# Patient Record
Sex: Female | Born: 1975 | Race: White | Hispanic: No | Marital: Single | Smoking: Never smoker
Health system: Southern US, Community
[De-identification: ages and names within clinical notes are randomized; demographics above are authoritative.]

## PROBLEM LIST (undated history)

## (undated) DIAGNOSIS — R42 Dizziness and giddiness: Secondary | ICD-10-CM

## (undated) DIAGNOSIS — M797 Fibromyalgia: Secondary | ICD-10-CM

---

## 2017-03-17 ENCOUNTER — Other Ambulatory Visit: Payer: Self-pay

## 2017-03-17 ENCOUNTER — Encounter: Payer: Self-pay | Admitting: Emergency Medicine

## 2017-03-17 ENCOUNTER — Emergency Department
Admission: EM | Admit: 2017-03-17 | Discharge: 2017-03-17 | Disposition: A | Discharge status: Home / Self Care | Payer: BC Managed Care – PPO | Attending: Emergency Medicine | Admitting: Emergency Medicine

## 2017-03-17 DIAGNOSIS — R55 Syncope and collapse: Secondary | ICD-10-CM

## 2017-03-17 HISTORY — DX: Fibromyalgia: M79.7

## 2017-03-17 HISTORY — DX: Dizziness and giddiness: R42

## 2017-03-17 LAB — CBC
HCT: 40.7 % (ref 35.0–47.0)
HEMOGLOBIN: 13.9 g/dL (ref 12.0–16.0)
MCH: 29.3 pg (ref 26.0–34.0)
MCHC: 34.3 g/dL (ref 32.0–36.0)
MCV: 85.5 fL (ref 80.0–100.0)
Platelets: 340 10*3/uL (ref 150–440)
RBC: 4.76 MIL/uL (ref 3.80–5.20)
RDW: 13.3 % (ref 11.5–14.5)
WBC: 14.7 10*3/uL — ABNORMAL HIGH (ref 3.6–11.0)

## 2017-03-17 LAB — COMPREHENSIVE METABOLIC PANEL
ALT: 26 U/L (ref 14–54)
AST: 27 U/L (ref 15–41)
Albumin: 4.1 g/dL (ref 3.5–5.0)
Alkaline Phosphatase: 71 U/L (ref 38–126)
Anion gap: 13 (ref 5–15)
BUN: 17 mg/dL (ref 6–20)
CO2: 21 mmol/L — ABNORMAL LOW (ref 22–32)
Calcium: 8.7 mg/dL — ABNORMAL LOW (ref 8.9–10.3)
Chloride: 103 mmol/L (ref 101–111)
Creatinine, Ser: 0.83 mg/dL (ref 0.44–1.00)
GFR calc Af Amer: >60 mL/min (ref >60)
GFR calc non Af Amer: >60 mL/min (ref >60)
Glucose, Bld: 122 mg/dL — ABNORMAL HIGH (ref 65–99)
Potassium: 4 mmol/L (ref 3.5–5.1)
Sodium: 137 mmol/L (ref 135–145)
Total Bilirubin: 0.4 mg/dL (ref 0.3–1.2)
Total Protein: 7.9 g/dL (ref 6.5–8.1)

## 2017-03-17 LAB — TROPONIN I: Troponin I: <0.03 ng/mL (ref <0.03)

## 2017-03-17 MED ORDER — SODIUM CHLORIDE 0.9 % IV BOLUS (SEPSIS)
500.0000 mL | Freq: Once | INTRAVENOUS | Status: AC
  Administered 2017-03-17: 500 mL via INTRAVENOUS

## 2017-03-17 NOTE — ED Notes (Signed)
E signature pad not working, Pt verbalized understanding of discharge and follow up intructions

## 2017-03-17 NOTE — Discharge Instructions (Signed)
You have been seen today in the Emergency Department (ED)  for syncope (passing out).  Your evaluation was reassuring, and we all agree that panic/anxiety likely contributed to your situation.  Your symptoms may be due to mild dehydration, so it is important that you drink plenty of non-alcoholic fluids.  As we discussed, lab results were not available due to equipment malfunction, but given that you have been feeling well during an extended period of observation, we agreed it is okay for you to go home.  Please call your regular doctor as soon as possible to schedule the next available clinic appointment to follow up with him/her regarding your visit to the ED and your symptoms.  Return to the Emergency Department (ED)  if you have any further syncopal episodes (pass out again) or develop ANY chest pain, pressure, tightness, trouble breathing, sudden sweating, or other symptoms that concern you.

## 2017-03-17 NOTE — ED Provider Notes (Signed)
[facility] Emergency Department Provider Note  ____________________________________________   First MD Initiated Contact with Patient 03/17/17 [phone]     (approximate)  I have reviewed the triage vital signs and the nursing notes.   HISTORY  Chief Complaint Loss of Consciousness    HPI Amy [name] is a 42 y.o. female who arrives by EMS for evaluation of 2 syncopal episodes and dizziness.  She is awake and alert and fully oriented at this time.  She reports that she has had some dizziness in the past after eating scallops.  Tonight she had shrimp and scallops for dinner when she went to bed and lie down she felt dizzy, as if the room was spinning.  She began to be concerned she was having allergic reaction and looked up on the Internet whether or not shellfish can cause dizziness.  She reports that she read about anaphylaxis and panicked that she was having an anaphylactic reaction.  She called her parents and told him to come over immediately because she thought she was having anaphylaxis, but before her parents could get there, she called 911.  She reports that she lie down on the floor and thinks she passed out while she was on the floor, then woke up, walked downstairs, and when she got to the front door she passed out again, striking her forehead against the door.  She was there when EMS arrived.  She has 2 small abrasions to her forehead.  She feels normal now after 500 mL of normal saline.  She does not have any additional dizziness.  EMS does report that she was hypotensive when they arrived but after 500 mL normal saline her blood pressure was within the normal range.  The only other symptom was nausea.  The patient reports that she has not been ill recently.  She denies fever/chills, chest pain, shortness of breath,  Vomiting, abdominal pain, and diarrhea.  She has not had similar episodes in the past but she said that she does suffer from panic attacks and  she asked somewhat embarrassed and says that she thinks that she had a panic attack when she thought she was having anaphylaxis.  She took no medications at home and her symptoms have resolved without any additional intervention such as Benadryl or steroids.   Past Medical History:  Diagnosis Date  . Fibromyalgia   . Vertigo     There are no active problems to display for this patient.   History reviewed. No pertinent surgical history.  Prior to Admission medications   Not on File    Allergies Amoxicillin; Naproxen; and Penicillins  History reviewed. No pertinent family history.  Social History Social History   Tobacco Use  . Smoking status: Never Smoker  . Smokeless tobacco: Never Used  Substance Use Topics  . Alcohol use: Not on file  . Drug use: Not on file    Review of Systems Constitutional: No fever/chills Eyes: No visual changes. ENT: No sore throat. Cardiovascular: Denies chest pain.  Reportedly had syncope x 2. Respiratory: Denies shortness of breath. Gastrointestinal: No abdominal pain.  Nausea, no vomiting.  No diarrhea.  No constipation. Genitourinary: Negative for dysuria. Musculoskeletal: Negative for neck pain.  Negative for back pain. Integumentary: Negative for rash. Neurological: Dizziness.  Negative for headaches, focal weakness or numbness.   ____________________________________________   PHYSICAL EXAM:  VITAL SIGNS: ED Triage Vitals  Enc Vitals Group     BP 03/17/17 0204 126/87  Pulse Rate 03/17/17 0204 65     Resp 03/17/17 0204 20     Temp --      Temp src --      SpO2 03/17/17 0204 100 %     Weight 03/17/17 0201 84.4 kg (186 lb)     Height 03/17/17 0201 1.575 m (5\' 2" )     Head Circumference --      Peak Flow --      Pain Score 03/17/17 0201 5     Pain Loc --      Pain Edu? --      Excl. in GC? --     Constitutional: Alert and oriented. Well appearing and in no acute distress. Eyes: Conjunctivae are normal.  Head:  Contusion and small abrasion on forehead in two separate locations (near hairline and in the center of forehead) Nose: No congestion/rhinnorhea. Mouth/Throat: Mucous membranes are moist. Neck: No stridor.  No meningeal signs.  No cervical spine tenderness to palpation. Cardiovascular: Normal rate, regular rhythm. Good peripheral circulation. Grossly normal heart sounds. Respiratory: Normal respiratory effort.  No retractions. Lungs CTAB. Gastrointestinal: Soft and nontender. No distention.  Musculoskeletal: No lower extremity tenderness nor edema. No gross deformities of extremities. Neurologic:  Normal speech and language. No gross focal neurologic deficits are appreciated.  Skin:  Skin is warm, dry and intact. No rash noted. Psychiatric: Mood and affect are normal. Speech and behavior are normal.  ____________________________________________   LABS (all labs ordered are listed, but only abnormal results are displayed)  Labs Reviewed  COMPREHENSIVE METABOLIC PANEL - Abnormal; Notable for the following components:      Result Value   CO2 21 (*)    Glucose, Bld 122 (*)    Calcium 8.7 (*)    All other components within normal limits  CBC - Abnormal; Notable for the following components:   WBC 14.7 (*)    All other components within normal limits  TROPONIN I  URINALYSIS, ROUTINE W REFLEX MICROSCOPIC   ____________________________________________  EKG  ED ECG REPORT I, Loleta Rose, the attending physician, personally viewed and interpreted this ECG.  Date: 03/17/2017 EKG Time: 2:05 AM Rate: 67 Rhythm: normal sinus rhythm QRS Axis: normal Intervals: normal ST/T Wave abnormalities: normal Narrative Interpretation: no evidence of acute ischemia  ____________________________________________  RADIOLOGY   ED MD interpretation: No indication for imaging  Official radiology report(s): No results  found.  ____________________________________________   PROCEDURES  Critical Care performed: No   Procedure(s) performed:   Procedures   ____________________________________________   INITIAL IMPRESSION / ASSESSMENT AND PLAN / ED COURSE  As part of my medical decision making, I reviewed the following data within the electronic MEDICAL RECORD NUMBER Nursing notes reviewed and incorporated, Labs reviewed  and EKG interpreted     Differential diagnosis includes, but is not limited to, vasovagal syncope, orthostatic hypotension/syncope, cardiogenic syncope, anaphylaxis, panic attack.  Based on her description, I think that she might of had a very mild allergic reaction to the scallops similar to prior but then she panicked she researched anaphylaxis.  Her symptoms got acutely worse and I think she had vasovagal episode(s).  Blood pressure is stable and she is asymptomatic with no postictal state.  She has a mild leukocytosis.  As of 4:35 AM, metabolic panel and troponin are not available.  The lab has been struggling for the last 2-1/2 hours and has had a lot of equipment malfunctions.  The patient is asymptomatic with a normal blood pressure.  She  has been able to get up and walk to the bathroom with no repeat dizziness and certainly no additional syncopal episodes.  I gave her the option of staying for the lab results to come back or going home given that there is not any one specific thing likely to come back as acute or emergent and her blood work, and she is comfortable with the plan to go home.  She has her parents with her who can help look after her.  I gave my usual and customary return precautions.   Clinical Course as of Mar 17 446  Tue Mar 17, 2017  0447 CMP resulted, results are reassuring.  Troponin is still down.  [CF]    Clinical Course User Index [CF] Loleta RoseForbach, Ethelda Deangelo, MD    ____________________________________________  FINAL CLINICAL IMPRESSION(S) / ED DIAGNOSES  Final  diagnoses:  Syncope and collapse     MEDICATIONS GIVEN DURING THIS VISIT:  Medications  sodium chloride 0.9 % bolus 500 mL (0 mLs Intravenous Stopped 03/17/17 0448)     ED Discharge Orders    None       Note:  This document was prepared using Dragon voice recognition software and may include unintentional dictation errors.    Loleta RoseForbach, Azuree Minish, MD 03/17/17 [phone]

## 2017-03-17 NOTE — ED Triage Notes (Signed)
Pt in with co dizziness states syncope x 2, ems found her on the floor with small lac to forehead. FSBS per ems 167, initial bp per ems 90/47 and 80/37. They gave her 1/2L of ns which got her bp up to 114/89.

## 2019-11-14 ENCOUNTER — Other Ambulatory Visit: Payer: Self-pay | Admitting: Neurology

## 2019-11-14 DIAGNOSIS — H9190 Unspecified hearing loss, unspecified ear: Secondary | ICD-10-CM

## 2019-11-25 ENCOUNTER — Other Ambulatory Visit: Payer: Self-pay

## 2019-11-25 ENCOUNTER — Ambulatory Visit
Admission: RE | Admit: 2019-11-25 | Discharge: 2019-11-25 | Disposition: A | Discharge status: Home / Self Care | Payer: BC Managed Care – PPO | Source: Ambulatory Visit | Attending: Neurology | Admitting: Neurology

## 2019-11-25 DIAGNOSIS — H9190 Unspecified hearing loss, unspecified ear: Secondary | ICD-10-CM | POA: Diagnosis present

## 2019-11-25 MED ORDER — GADOBUTROL 1 MMOL/ML IV SOLN
8.0000 mL | Freq: Once | INTRAVENOUS | Status: AC | PRN
  Administered 2019-11-25: 8 mL via INTRAVENOUS

## 2021-09-27 LAB — EXTERNAL GENERIC LAB PROCEDURE: COLOGUARD: NEGATIVE

## 2021-10-15 ENCOUNTER — Other Ambulatory Visit: Payer: Self-pay | Admitting: Student

## 2021-10-15 ENCOUNTER — Other Ambulatory Visit (HOSPITAL_COMMUNITY): Payer: Self-pay | Admitting: Student

## 2021-10-15 DIAGNOSIS — R5383 Other fatigue: Secondary | ICD-10-CM

## 2021-10-15 DIAGNOSIS — R42 Dizziness and giddiness: Secondary | ICD-10-CM

## 2021-10-30 ENCOUNTER — Ambulatory Visit
Admission: RE | Admit: 2021-10-30 | Discharge: 2021-10-30 | Disposition: A | Discharge status: Home / Self Care | Payer: BC Managed Care – PPO | Source: Ambulatory Visit | Attending: Student | Admitting: Student

## 2021-10-30 DIAGNOSIS — R42 Dizziness and giddiness: Secondary | ICD-10-CM | POA: Insufficient documentation

## 2021-10-30 DIAGNOSIS — R5383 Other fatigue: Secondary | ICD-10-CM | POA: Diagnosis not present

## 2022-07-19 IMAGING — MR MR HEAD WO/W CM
12 series · 48 of 48 positions shown · IV contrast (gadavist)
Comparison: None.

CLINICAL DATA: Sudden onset of recurring dizziness and headaches.

EXAM:
MRI HEAD WITHOUT AND WITH CONTRAST
TECHNIQUE: Multiplanar, multiecho pulse sequences of the brain and surrounding
structures were obtained without and with intravenous contrast.
CONTRAST:  8mL GADAVIST GADOBUTROL 1 MMOL/ML IV SOLN

[Series 3: DWI · axial · 3.0mm · 1.20mm/px · z∈[-33,+128]mm · 3 of 55 slices shown (1 of 2)]
[im 1/55]
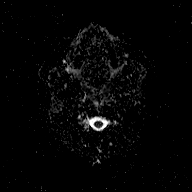
[im 28/55]
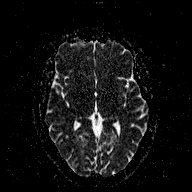
[im 55/55]
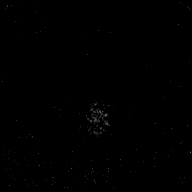

[Series 4: T2 · axial · 5.0mm · 0.72mm/px · 1 of 23 slices shown (1 of 2)]
[im 1/23]
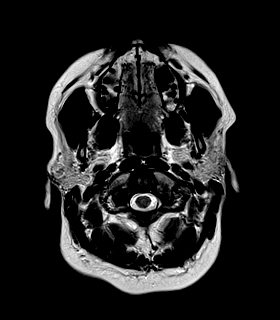

[Series 6: DWI · coronal · 3.0mm · 1.15mm/px · 3 of 45 slices shown (2 of 2)]
[im 1/45]
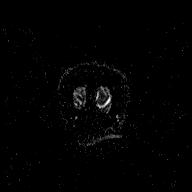
[im 23/45]
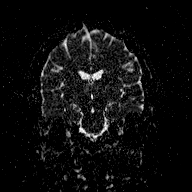
[im 45/45]
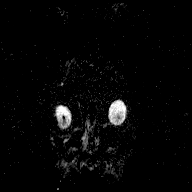

[Series 7: T1 · sagittal · 5.0mm · 0.45mm/px · 2 of 23 slices shown (1 of 2)]
[im 1/23]
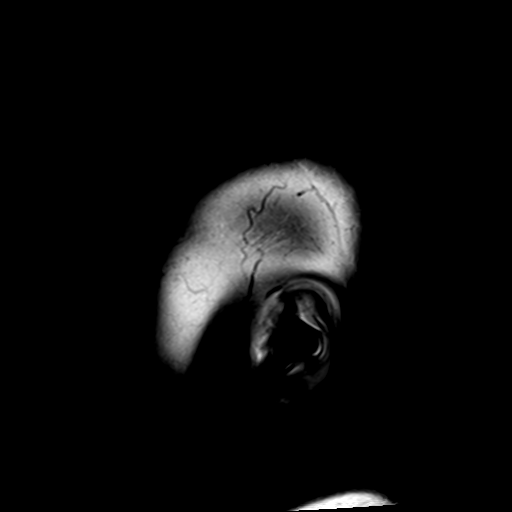
[im 23/23]
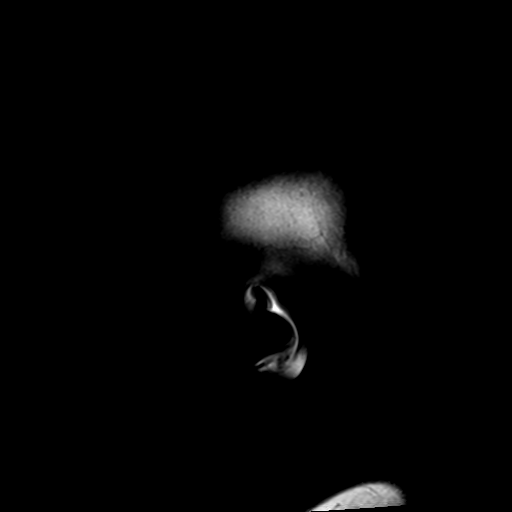

[Series 8: FLAIR · axial · 3.0mm · 0.45mm/px · z∈[-40,+122]mm · 4 of 55 slices shown]
[im 1/55]
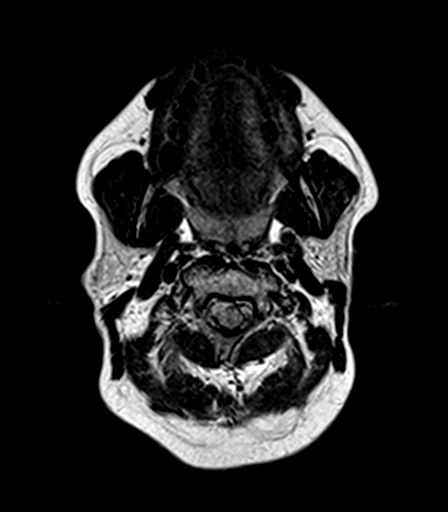
[im 19/55]
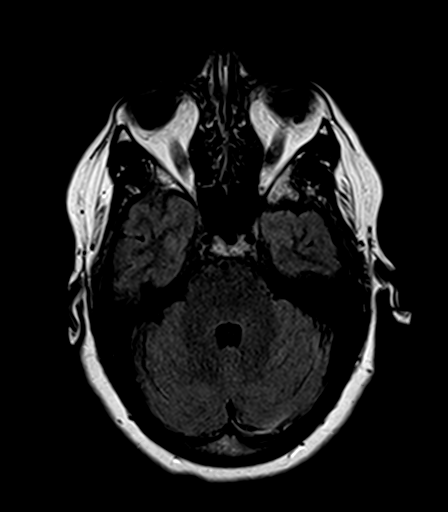
[im 37/55]
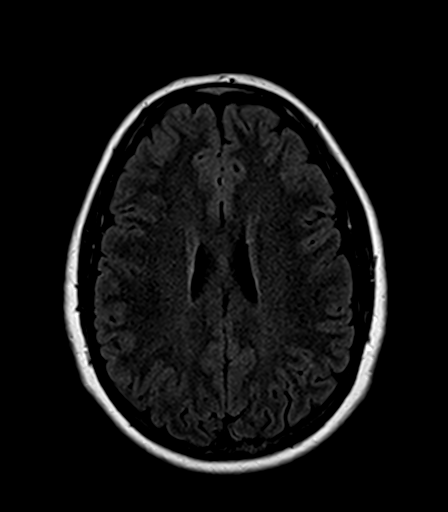
[im 55/55]
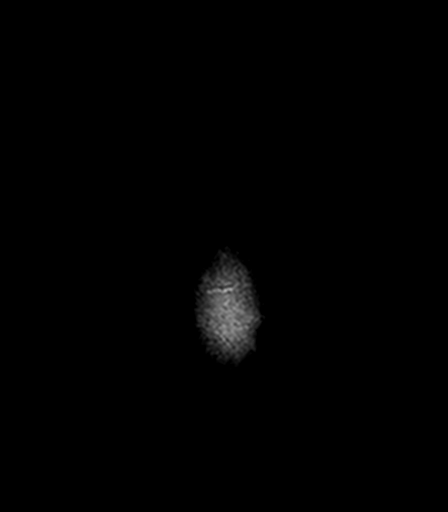

[Series 9: T2 · axial · 5.0mm · 0.72mm/px · z∈[-36,+118]mm · 2 of 23 slices shown (2 of 2)]
[im 1/23]
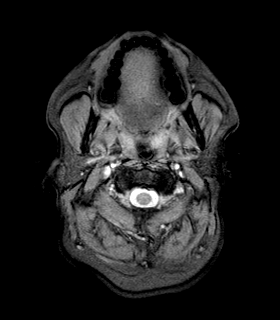
[im 23/23]
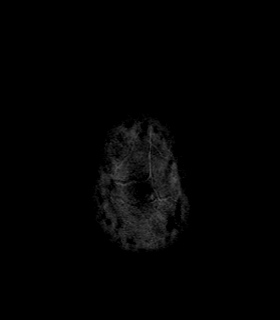

[Series 10: T1 · axial · 1.0mm · 1.00mm/px · z∈[-38,+121]mm · 11 of 160 slices shown (2 of 2)]
[im 1/160]
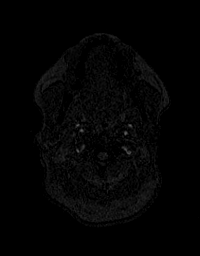
[im 16/160]
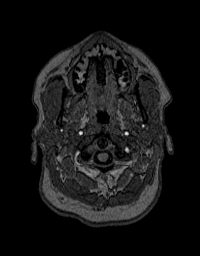
[im 32/160]
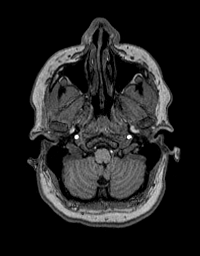
[im 48/160]
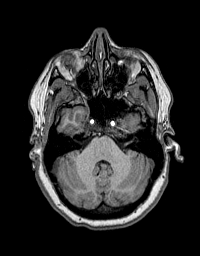
[im 64/160]
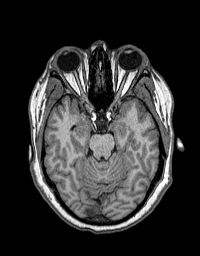
[im 80/160]
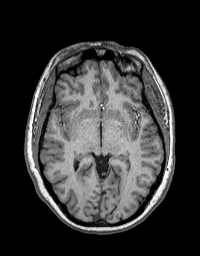
[im 96/160]
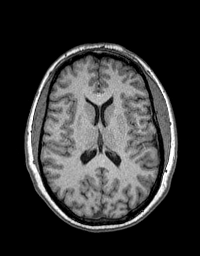
[im 112/160]
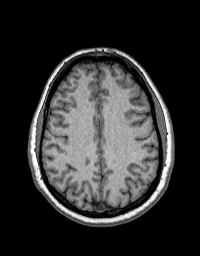
[im 128/160]
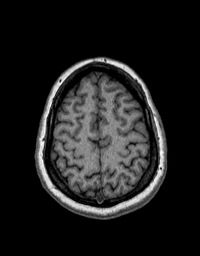
[im 144/160]
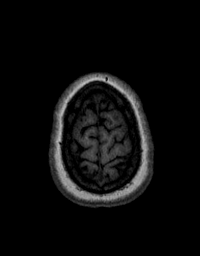
[im 160/160]
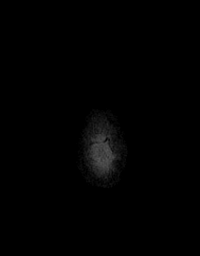

[Series 11: T2 post-contrast · coronal · 5.0mm · 0.43mm/px · 2 of 31 slices shown]
[im 1/31]
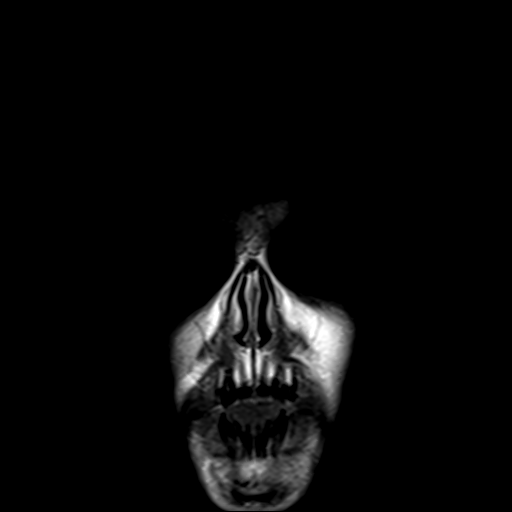
[im 31/31]
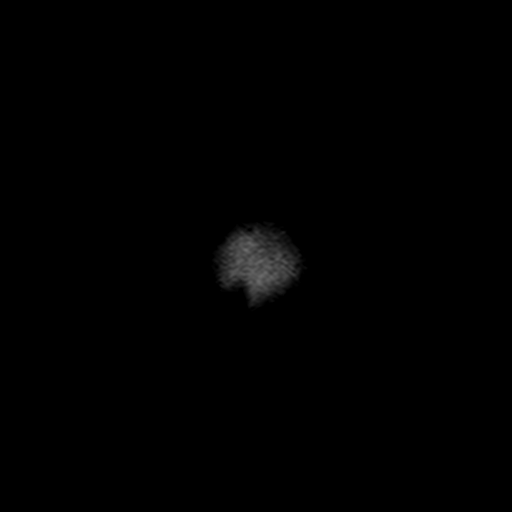

[Series 12: T1 post-contrast · axial · 1.0mm · 1.00mm/px · z∈[-38,+121]mm · 11 of 160 slices shown (1 of 2)]
[im 1/160]
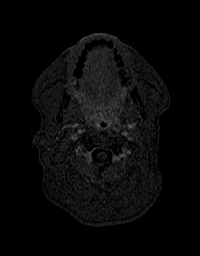
[im 16/160]
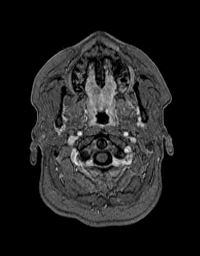
[im 32/160]
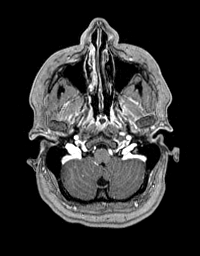
[im 48/160]
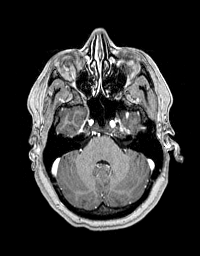
[im 64/160]
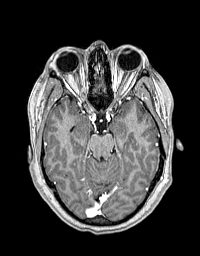
[im 80/160]
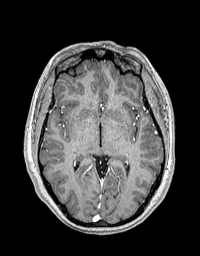
[im 96/160]
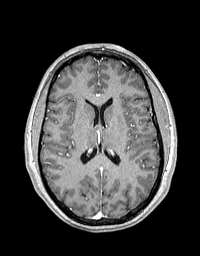
[im 112/160]
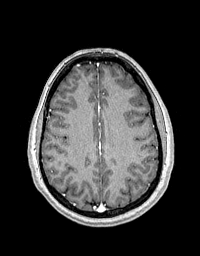
[im 128/160]
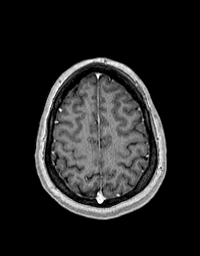
[im 144/160]
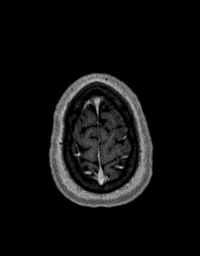
[im 160/160]
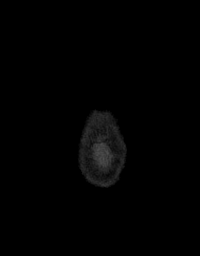

[Series 13: T1 post-contrast · coronal · 5.0mm · 0.43mm/px · 2 of 31 slices shown (2 of 2)]
[im 1/31]
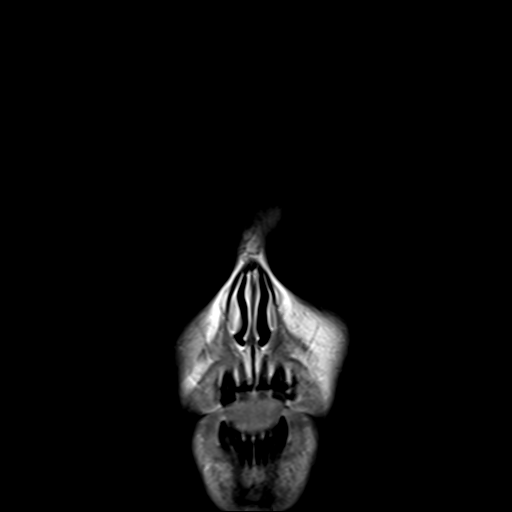
[im 31/31]
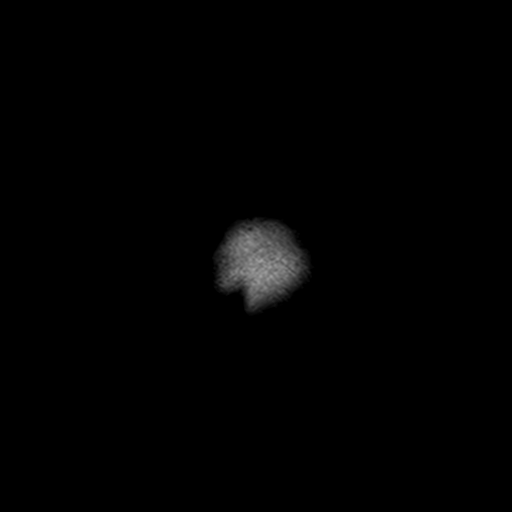

[Series 100: 100 new series · axial · 3.0mm · 1.20mm/px · z∈[-33,+128]mm · 4 of 55 slices shown]
[im 1/55]
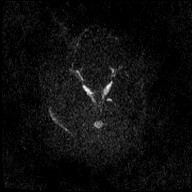
[im 19/55]
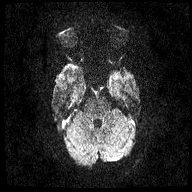
[im 37/55]
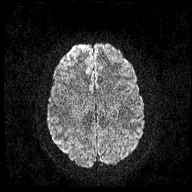
[im 55/55]
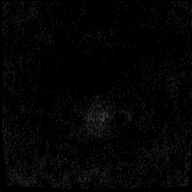

[Series 101: 101 new series · coronal · 3.0mm · 1.15mm/px · 3 of 45 slices shown]
[im 1/45]
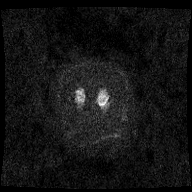
[im 23/45]
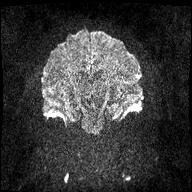
[im 45/45]
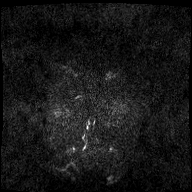

[48 of 48 positions shown; findings below may reference images not displayed]

FINDINGS: Brain: No acute infarction, hemorrhage, hydrocephalus, extra-axial
collection or mass lesion. The brain parenchyma has normal
morphology and signal characteristics. No focus of abnormal contrast
enhancement.

Vascular: Normal flow voids.

Skull and upper cervical spine: Normal marrow signal.

Sinuses/Orbits: Negative.
IMPRESSION: Unremarkable MRI of the brain.

## 2022-11-23 ENCOUNTER — Ambulatory Visit
Admission: EM | Admit: 2022-11-23 | Discharge: 2022-11-23 | Disposition: A | Discharge status: Home / Self Care | Payer: BC Managed Care – PPO | Attending: Family Medicine | Admitting: Family Medicine

## 2022-11-23 ENCOUNTER — Encounter: Payer: Self-pay | Admitting: Emergency Medicine

## 2022-11-23 DIAGNOSIS — G43909 Migraine, unspecified, not intractable, without status migrainosus: Secondary | ICD-10-CM

## 2022-11-23 MED ORDER — METOCLOPRAMIDE HCL 5 MG/ML IJ SOLN
10.0000 mg | Freq: Once | INTRAMUSCULAR | Status: AC
  Administered 2022-11-23: 10 mg via INTRAMUSCULAR

## 2022-11-23 MED ORDER — KETOROLAC TROMETHAMINE 60 MG/2ML IM SOLN
30.0000 mg | Freq: Once | INTRAMUSCULAR | Status: AC
  Administered 2022-11-23: 30 mg via INTRAMUSCULAR

## 2022-11-23 MED ORDER — PROCHLORPERAZINE MALEATE 5 MG PO TABS: 5.0000 mg | ORAL_TABLET | Freq: Four times a day (QID) | ORAL | 0 refills | Status: AC | PRN

## 2022-11-23 NOTE — ED Provider Notes (Signed)
[facility]    CSN: [id number] Arrival date & time: 11/23/22  2355      History   Chief Complaint Chief Complaint  Patient Amy [name] with   Headache    HPI Amy Amy [name] is a 47 y.o. female.   HPI   Amy Amy [name] for bilateral frontal headache started on Monday.  Pain rated 4/10 at triage but now 2/10.  Has light and sound sensitivity with nausea.  Has history of migraines but hasn't had one since January 2024.  Took Execedrin without relief. Motrin 800 mg helped somewhat. Has color sensitivity which a known trigger and recently after seeing document with bright yellow, pink and green. She started having a headache. Denies vomiting, fever, trauma or falls.   She has been laying down a lot and it feels like she has a "crick in my neck."      Past Medical History:  Diagnosis Date   Fibromyalgia    Vertigo     There are no problems to display for this patient.   History reviewed. No pertinent surgical history.  OB History   No obstetric history on file.      Home Medications    Prior to Admission medications   Medication Sig Start Date End Date Taking? Authorizing Provider  clobetasol ointment (TEMOVATE) 0.05 % Apply 1 Application topically 2 (two) times daily. 12/12/20  Yes [provider]  DULoxetine (CYMBALTA) 20 MG capsule Take 20 mg by mouth daily. 11/10/09  Yes [provider]  DULoxetine HCl 40 MG CPEP 40 mg once daily 05/21/21  Yes [provider]  prochlorperazine (COMPAZINE) 5 MG tablet Take 1 tablet (5 mg total) by mouth every 6 (six) hours as needed for nausea or vomiting. 11/23/22  Yes Kaliq Lege, Seward Meth, DO  Cholecalciferol (VITAMIN D-1000 MAX ST) 25 MCG (1000 UT) tablet Take 1,000 Units by mouth daily.    [provider]  cyanocobalamin (VITAMIN B12) 1000 MCG tablet Take 1,000 mcg by mouth daily.    [provider]    Family History History reviewed. No pertinent family history.  Social  History Social History   Tobacco Use   Smoking status: Every Day    Types: Cigarettes   Smokeless tobacco: Never  Vaping Use   Vaping status: Never Used  Substance Use Topics   Alcohol use: Not Currently   Drug use: Never     Allergies   Amoxicillin, Naproxen, and Penicillins   Review of Systems Review of Systems: negative unless otherwise stated in HPI.      Physical Exam Triage Vital Signs ED Triage Vitals  Encounter Vitals Group     BP 11/23/22 0855 (!) 141/84     Systolic BP Percentile --      Diastolic BP Percentile --      Pulse Rate 11/23/22 0855 68     Resp 11/23/22 0855 14     Temp 11/23/22 0855 98.4 F (36.9 C)     Temp Source 11/23/22 0855 Oral     SpO2 11/23/22 0855 98 %     Weight 11/23/22 0850 186 lb 1.1 oz (84.4 kg)     Height 11/23/22 0850 5\' 2"  (1.575 m)     Head Circumference --      Peak Flow --      Pain Score 11/23/22 0850 4     Pain Loc --      Pain Education --      Exclude from Growth Chart --  No data found.  Updated Vital Signs BP (!) 141/84 (BP Location: Left Arm)   Pulse 68   Temp 98.4 F (36.9 C) (Oral)   Resp 14   Ht 5\' 2"  (1.575 m)   Wt 84.4 kg   LMP 09/10/2022 (Approximate)   SpO2 98%   BMI 34.03 kg/m   Visual Acuity Right Eye Distance:   Left Eye Distance:   Bilateral Distance:    Right Eye Near:   Left Eye Near:    Bilateral Near:     Physical Exam GEN:     alert, uncomfortable appearing female and no distress    HENT:  mucus membranes moist, oropharyngeal without lesions or erythema,  nares patent, no nasal discharge uvula midline EYES:   pupils equal and reactive, EOM intact NECK:  supple, normal ROM RESP:  clear to auscultation bilaterally, no increased work of breathing  CVS:   regular rate and rhythm EXT:  Good range of motion, RUE strength 4-/5, LUE 4/5 (not new per patient) NEURO:  alert, oriented, speech normal, CN 2-12 grossly intact, no facial droop,  sensation grossly intact, strength 5/5  bilateral LE, normal coordination Skin:   warm and dry      UC Treatments / Results  Labs (all labs ordered are listed, but only abnormal results are displayed) Labs Reviewed - No data to display  EKG  If EKG performed, see my interpretation in the MDM section  Radiology No results found.   Procedures Procedures (including critical care time)  Medications Ordered in UC Medications  ketorolac (TORADOL) injection 30 mg (30 mg Intramuscular Given 11/23/22 1027)  metoCLOPramide (REGLAN) injection 10 mg (10 mg Intramuscular Given 11/23/22 1029)    Initial Impression / Assessment and Plan / UC Course  I have reviewed the triage vital signs and the nursing notes.  Pertinent labs & imaging results that were available during my care of the patient were reviewed by me and considered in my medical decision making (see chart for details).       Patient is a 47 y.o. female with history of migraines who Amy [name] for headache for the past week.    Differential diagnosis includes but is not limited to migraine headache, tension headache, cluster headache, CVA, depression, intracranial hemorrhage, meningitis/encephalitis, giant cell arteritis, idiopathic intracranial hypertension, dehydration and analgesia abuse. There are no red flag symptoms associated with headache.    On chart review, had negative MRI head in October 2023.  She had a negative MRI brain in November 2021.  Reports having a follow-up visit with her neurologist later this week.  Vital signs are stable.  Overall, patient is uncomfortable-appearing, well-hydrated and in no acute distress.  Cardiopulmonary and neuro exams are normal.  Patient given Toradol 30 mg IM and Reglan 10 mg IM.  Treat with headache cocktail (Benadryl, antiemetic and NSAID) at home.  Offered muscle relaxer for musculoskeletal cervical strain but she declined.  ED and return precautions reviewed. Advised to follow up with neurologist as  scheduled.  Discussed MDM, treatment plan and plan for follow-up with patient who agrees with plan.      Final Clinical Impressions(s) / UC Diagnoses   Final diagnoses:  Migraine without status migrainosus, not intractable, unspecified migraine type     Discharge Instructions      Follow-up with your neurologist as scheduled. For your headache at home: Take 1000 mg of Tylenol, 600 mg of ibuprofen, 1 tablet of Benadryl and 1 tablet of Compazine every 6 hours as  needed.  If your headache gets suddenly worse, go to the emergency department or call an ambulance.     ED Prescriptions     Medication Sig Dispense Auth. Provider   prochlorperazine (COMPAZINE) 5 MG tablet Take 1 tablet (5 mg total) by mouth every 6 (six) hours as needed for nausea or vomiting. 30 tablet Katha Cabal, DO      PDMP not reviewed this encounter.   Katha Cabal, DO 11/23/22 1030

## 2022-11-23 NOTE — Discharge Instructions (Addendum)
Follow-up with your neurologist as scheduled. For your headache at home: Take 1000 mg of Tylenol, 600 mg of ibuprofen, 1 tablet of Benadryl and 1 tablet of Compazine every 6 hours as needed.  If your headache gets suddenly worse, go to the emergency department or call an ambulance.

## 2022-11-23 NOTE — ED Triage Notes (Signed)
Patient reports headache that started a week ago.  Patient reports light sensitivity and nausea.  Patient has tried OTC pain medicine but has not helped.
